# Patient Record
Sex: Male | Born: 1955 | Race: White | Hispanic: No | Marital: Married
Health system: Southern US, Community
[De-identification: ages and names within clinical notes are randomized; demographics above are authoritative.]

## PROBLEM LIST (undated history)

## (undated) HISTORY — DX: Morbid (severe) obesity due to excess calories: E66.01

---

## 2015-12-29 ENCOUNTER — Ambulatory Visit (INDEPENDENT_AMBULATORY_CARE_PROVIDER_SITE_OTHER): Payer: BLUE CROSS/BLUE SHIELD

## 2015-12-29 ENCOUNTER — Ambulatory Visit (INDEPENDENT_AMBULATORY_CARE_PROVIDER_SITE_OTHER): Payer: BLUE CROSS/BLUE SHIELD | Admitting: Sports Medicine

## 2015-12-29 ENCOUNTER — Encounter: Payer: Self-pay | Admitting: Sports Medicine

## 2015-12-29 DIAGNOSIS — M79671 Pain in right foot: Secondary | ICD-10-CM | POA: Diagnosis not present

## 2015-12-29 DIAGNOSIS — M2041 Other hammer toe(s) (acquired), right foot: Secondary | ICD-10-CM | POA: Diagnosis not present

## 2015-12-29 DIAGNOSIS — M779 Enthesopathy, unspecified: Secondary | ICD-10-CM

## 2015-12-29 NOTE — Progress Notes (Signed)
  Subjective: Miguel Beard is a 60 y.o. male patient who presents to office for evaluation of Right foot pain. Patient complains of occasional pain at ball of foot that is worse with walking barefoot x 15 years. Aspercream and change of shoes helps. Patient denies any other pedal complaints.   There are no active problems to display for this patient.   No current outpatient prescriptions on file prior to visit.   No current facility-administered medications on file prior to visit.     No Known Allergies  Objective:  General: Alert and oriented x3 in no acute distress  Dermatology: No open lesions bilateral lower extremities, no webspace macerations, no ecchymosis bilateral, all nails x 10 are well manicured.  Vascular: Dorsalis Pedis and Posterior Tibial pedal pulses 2/4, Capillary Fill Time 3 seconds,(+) pedal hair growth bilateral, no edema bilateral lower extremities, Temperature gradient within normal limits.  Neurology: Gross sensation intact via light touch bilateral. (- )Tinels sign right foot.  Musculoskeletal: Minimal tenderness to palpation sub met 2 on right. Semiflexible hammertoes, Ankle, Subtalar, Midtarsal, and MTPJ joint range of motion is within normal limits. No pain with calf compression bilateral. Strength within normal limits in all groups bilateral.   Xrays  Right Foot    Impression:Normal osseous mineralization, inferior spur, biparte fibular sesamoid, hammertoe, ankle arthritis, soft tissues normal.        Assessment and Plan: Problem List Items Addressed This Visit    None    Visit Diagnoses    Right foot pain    -  Primary   Relevant Orders   DG Foot 2 Views Right   Capsulitis       Hammer toe of right foot          -Complete examination performed -Xrays reviewed -Discussed treatement options for hammertoe and capsulitis  -Patient declined injection -Dispensed offloading pad and instructed on use if continues to work well will benefit from  orthotics; patient to call if desires orthotics -Recommend good supportive shoes -Recommend continue with aspercream as needed -Patient to return to office as needed or sooner if condition worsens.  Landis Martins, DPM

## 2017-11-21 ENCOUNTER — Other Ambulatory Visit (HOSPITAL_COMMUNITY): Payer: Self-pay | Admitting: Orthopaedic Surgery

## 2017-11-21 DIAGNOSIS — M7989 Other specified soft tissue disorders: Principal | ICD-10-CM

## 2017-11-21 DIAGNOSIS — M79604 Pain in right leg: Secondary | ICD-10-CM

## 2017-11-22 ENCOUNTER — Ambulatory Visit (HOSPITAL_COMMUNITY)
Admission: RE | Admit: 2017-11-22 | Discharge: 2017-11-22 | Disposition: A | Discharge status: Home / Self Care | Payer: BLUE CROSS/BLUE SHIELD | Source: Ambulatory Visit | Attending: Orthopaedic Surgery | Admitting: Orthopaedic Surgery

## 2017-11-22 DIAGNOSIS — M79604 Pain in right leg: Secondary | ICD-10-CM | POA: Diagnosis present

## 2017-11-22 DIAGNOSIS — M7989 Other specified soft tissue disorders: Secondary | ICD-10-CM

## 2017-11-22 NOTE — Progress Notes (Signed)
Right lower extremity venous duplex has been completed. Negative for DVT. Results were given to [facility] at Dr. Nolon Nations office.  11/22/17 10:22 AM Olen Cordial RVT

## 2018-04-22 ENCOUNTER — Other Ambulatory Visit (HOSPITAL_COMMUNITY): Payer: Self-pay | Admitting: Orthopaedic Surgery

## 2018-04-22 ENCOUNTER — Ambulatory Visit (HOSPITAL_COMMUNITY)
Admission: RE | Admit: 2018-04-22 | Discharge: 2018-04-22 | Disposition: A | Discharge status: Home / Self Care | Payer: BLUE CROSS/BLUE SHIELD | Source: Ambulatory Visit | Attending: Orthopaedic Surgery | Admitting: Orthopaedic Surgery

## 2018-04-22 DIAGNOSIS — M7989 Other specified soft tissue disorders: Secondary | ICD-10-CM

## 2018-04-22 DIAGNOSIS — M79604 Pain in right leg: Secondary | ICD-10-CM

## 2018-04-22 NOTE — Progress Notes (Signed)
Right lower extremity venous duplex has been completed. Preliminary results can be found in CV Proc through chart review.  Results were given to [facility] at Dr. Nolon Nations office.  04/22/18 1:11 PM Olen Cordial RVT

## 2018-11-21 ENCOUNTER — Other Ambulatory Visit: Payer: Self-pay | Admitting: Otolaryngology

## 2018-11-21 DIAGNOSIS — R1314 Dysphagia, pharyngoesophageal phase: Secondary | ICD-10-CM

## 2018-11-28 ENCOUNTER — Ambulatory Visit
Admission: RE | Admit: 2018-11-28 | Discharge: 2018-11-28 | Disposition: A | Discharge status: Home / Self Care | Payer: BLUE CROSS/BLUE SHIELD | Source: Ambulatory Visit | Attending: Otolaryngology | Admitting: Otolaryngology

## 2018-11-28 DIAGNOSIS — R1314 Dysphagia, pharyngoesophageal phase: Secondary | ICD-10-CM

## 2019-11-23 ENCOUNTER — Encounter: Payer: Self-pay | Admitting: Physician Assistant

## 2019-11-23 ENCOUNTER — Ambulatory Visit (HOSPITAL_COMMUNITY)
Admission: RE | Admit: 2019-11-23 | Discharge: 2019-11-23 | Disposition: A | Discharge status: Home / Self Care | Payer: BLUE CROSS/BLUE SHIELD | Source: Ambulatory Visit | Attending: Pulmonary Disease | Admitting: Pulmonary Disease

## 2019-11-23 ENCOUNTER — Other Ambulatory Visit: Payer: Self-pay | Admitting: Physician Assistant

## 2019-11-23 DIAGNOSIS — U071 COVID-19: Secondary | ICD-10-CM

## 2019-11-23 MED ORDER — DIPHENHYDRAMINE HCL 50 MG/ML IJ SOLN: 50.0000 mg | Freq: Once | INTRAMUSCULAR | Status: DC | PRN

## 2019-11-23 MED ORDER — ALBUTEROL SULFATE HFA 108 (90 BASE) MCG/ACT IN AERS: 2.0000 | INHALATION_SPRAY | Freq: Once | RESPIRATORY_TRACT | Status: DC | PRN

## 2019-11-23 MED ORDER — PROMETHAZINE-DM 6.25-15 MG/5ML PO SYRP: 5.0000 mL | ORAL_SOLUTION | Freq: Four times a day (QID) | ORAL | 0 refills | Status: AC | PRN

## 2019-11-23 MED ORDER — SODIUM CHLORIDE 0.9 % IV SOLN
1200.0000 mg | Freq: Once | INTRAVENOUS | Status: AC
  Administered 2019-11-23: 1200 mg via INTRAVENOUS

## 2019-11-23 MED ORDER — SODIUM CHLORIDE 0.9 % IV SOLN: INTRAVENOUS | Status: DC | PRN

## 2019-11-23 MED ORDER — EPINEPHRINE 0.3 MG/0.3ML IJ SOAJ: 0.3000 mg | Freq: Once | INTRAMUSCULAR | Status: DC | PRN

## 2019-11-23 MED ORDER — FAMOTIDINE IN NACL 20-0.9 MG/50ML-% IV SOLN: 20.0000 mg | Freq: Once | INTRAVENOUS | Status: DC | PRN

## 2019-11-23 MED ORDER — METHYLPREDNISOLONE SODIUM SUCC 125 MG IJ SOLR: 125.0000 mg | Freq: Once | INTRAMUSCULAR | Status: DC | PRN

## 2019-11-23 NOTE — Discharge Instructions (Signed)

## 2019-11-23 NOTE — Progress Notes (Signed)
I connected by phone with Miguel Beard on 11/23/2019 at 11:28 AM to discuss the potential use of a new treatment for mild to moderate COVID-19 viral infection in non-hospitalized patients.  This patient is a 64 y.o. male that meets the FDA criteria for Emergency Use Authorization of COVID monoclonal antibody casirivimab/imdevimab.  Has a (+) direct SARS-CoV-2 viral test result  Has mild or moderate COVID-19   Is NOT hospitalized due to COVID-19  Is within 10 days of symptom onset  Has at least one of the high risk factor(s) for progression to severe COVID-19 and/or hospitalization as defined in EUA.  Specific high risk criteria : BMI > 25   I have spoken and communicated the following to the patient or parent/caregiver regarding COVID monoclonal antibody treatment:  1. FDA has authorized the emergency use for the treatment of mild to moderate COVID-19 in adults and pediatric patients with positive results of direct SARS-CoV-2 viral testing who are 70 years of age and older weighing at least 40 kg, and who are at high risk for progressing to severe COVID-19 and/or hospitalization.  2. The significant known and potential risks and benefits of COVID monoclonal antibody, and the extent to which such potential risks and benefits are unknown.  3. Information on available alternative treatments and the risks and benefits of those alternatives, including clinical trials.  4. Patients treated with COVID monoclonal antibody should continue to self-isolate and use infection control measures (e.g., wear mask, isolate, social distance, avoid sharing personal items, clean and disinfect "high touch" surfaces, and frequent handwashing) according to CDC guidelines.   5. The patient or parent/caregiver has the option to accept or refuse COVID monoclonal antibody treatment.  After reviewing this information with the patient, the patient has agreed to receive one of the available covid 19 monoclonal  antibodies and will be provided an appropriate fact sheet prior to infusion.  Sx onset 9/23. Set up for infusion on 9/26 @ 1:30pm. Directions given to [facility]. Pt is aware that insurance will be charged an infusion fee. Pt is unvaccinated.  Cline Crock 11/23/2019 11:28 AM

## 2019-11-23 NOTE — Progress Notes (Signed)
  Diagnosis: COVID-19  Physician: Dr. Wright   Procedure: [facility] Med: casirivimab\imdevimab infusion - Provided patient with casirivimab\imdevimab fact sheet for patients, parents and caregivers prior to infusion.  Complications: No immediate complications noted.  Discharge: Discharged home   Miguel Galvan  Beard 11/23/2019   

## 2020-05-21 IMAGING — RF DG ESOPHAGUS
8 of 9 series · 14 of 24 positions shown · non-contrast
Comparison: None.

CLINICAL DATA: Pharyngo esophageal dysphagia. Choking episodes with
liquids. Sticking sensation in the throat with meats.

EXAM:
ESOPHOGRAM / BARIUM SWALLOW / BARIUM TABLET STUDY
TECHNIQUE: Combined double contrast and single contrast examination performed
using effervescent crystals, thick barium liquid, and thin barium
liquid. The patient was observed with fluoroscopy swallowing a 13 mm
barium sulphate tablet.
FLUOROSCOPY TIME:  Fluoroscopy Time:  2 minutes 24 seconds
Radiation Exposure Index (if provided by the fluoroscopic device):
339 mGy
Number of Acquired Spot Images: 6

[Series 1: sequence · 2 of 27 frames shown (1 of 5)]
[frame 5/27]
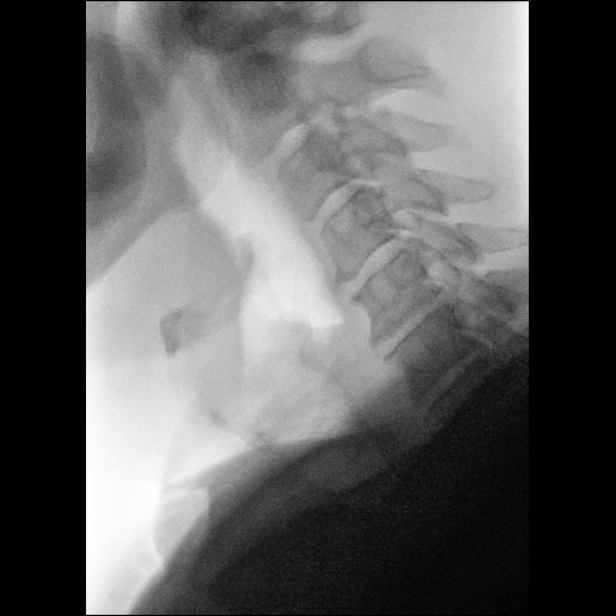
[frame 23/27]
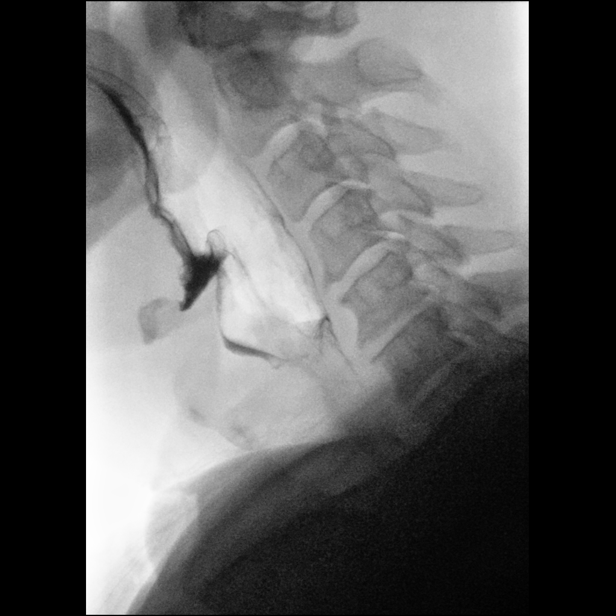

[Series 3: sequence · 2 of 19 frames shown (2 of 5)]
[frame 10/19]
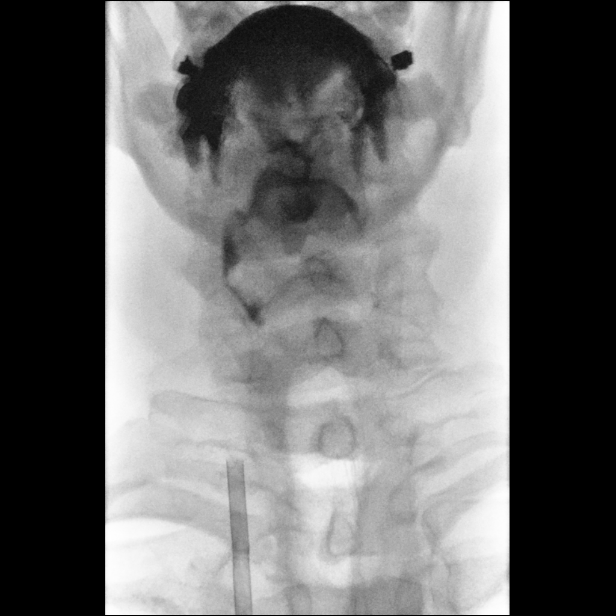
[frame 17/19]
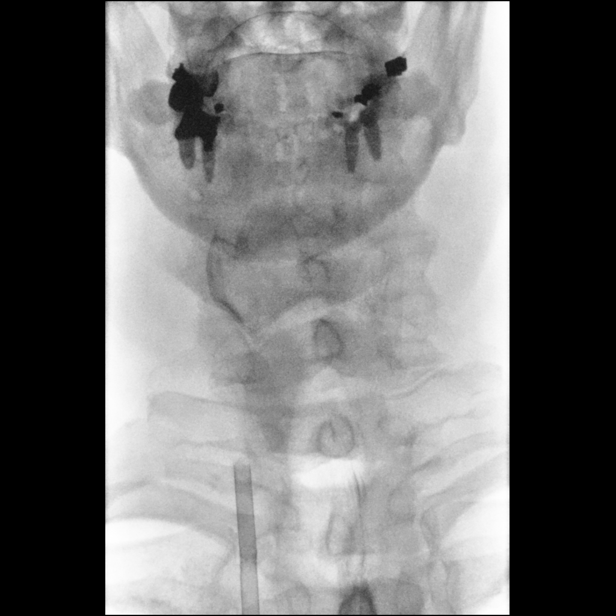

[Series 4: one shot · 0.15mm/px · 3 of 5 slices shown (1 of 3)]
[im 1/5]
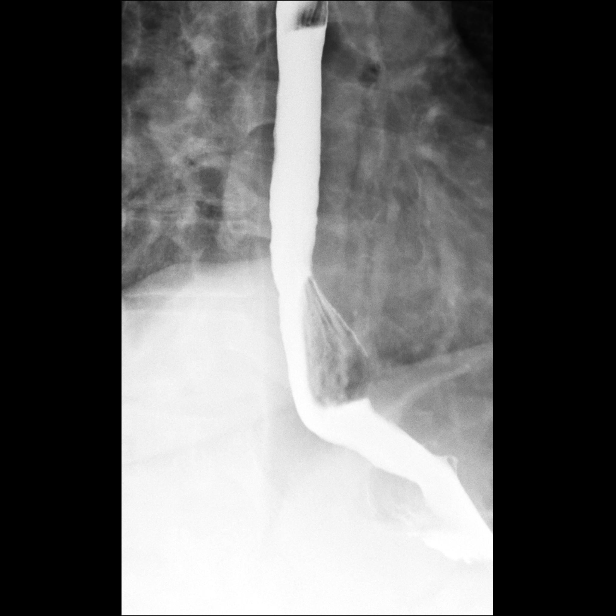
[im 3/5]
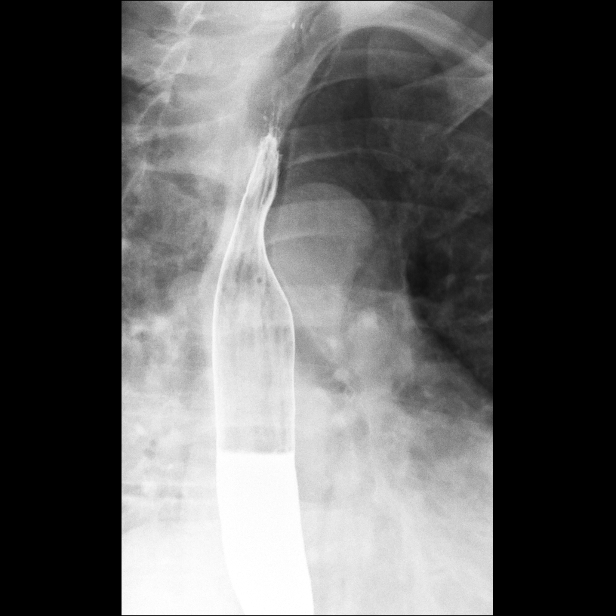
[im 5/5]
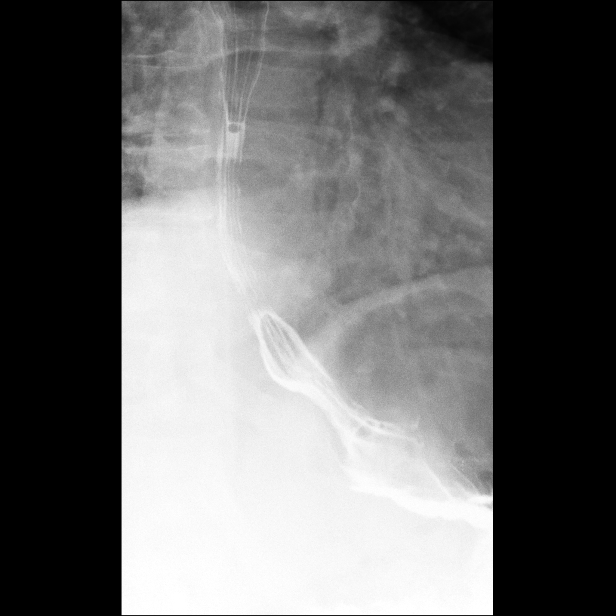

[Series 5: sequence · 2 of 44 frames shown (3 of 5)]
[frame 21/44]
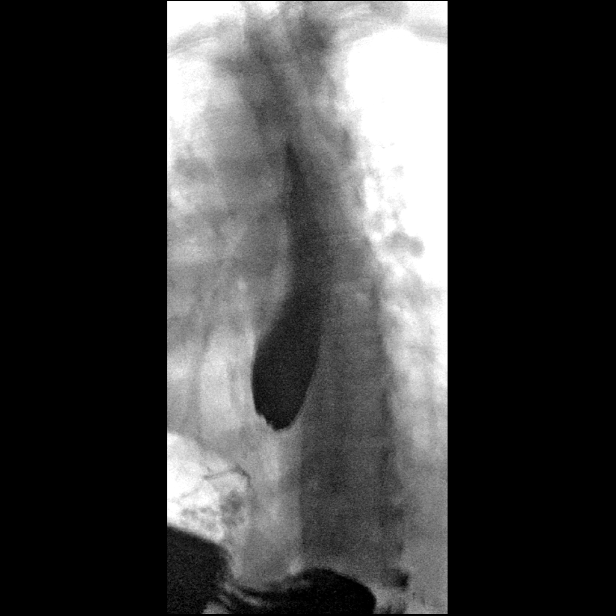
[frame 38/44]
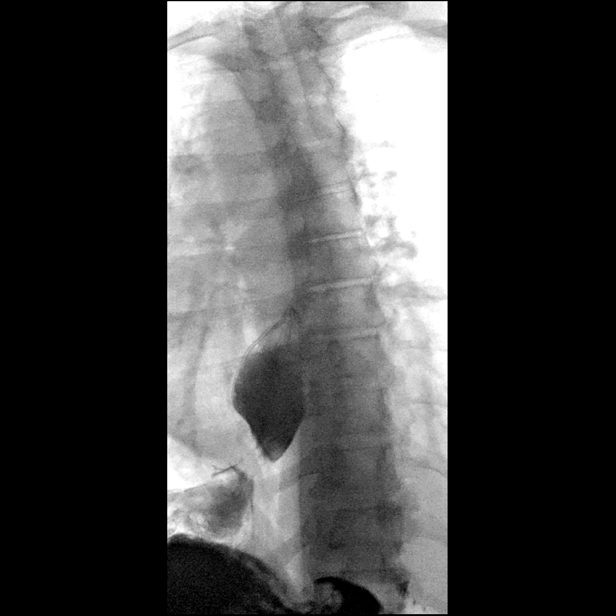

[Series 6: sequence · 1 of 79 frames shown (4 of 5)]
[frame 40/79]
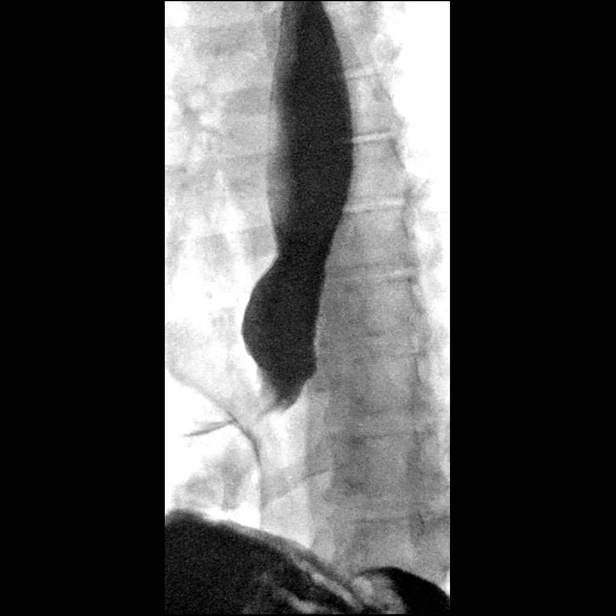

[Series 7: one shot · 1 of 1 slices shown (2 of 3)]
[im 1/1]
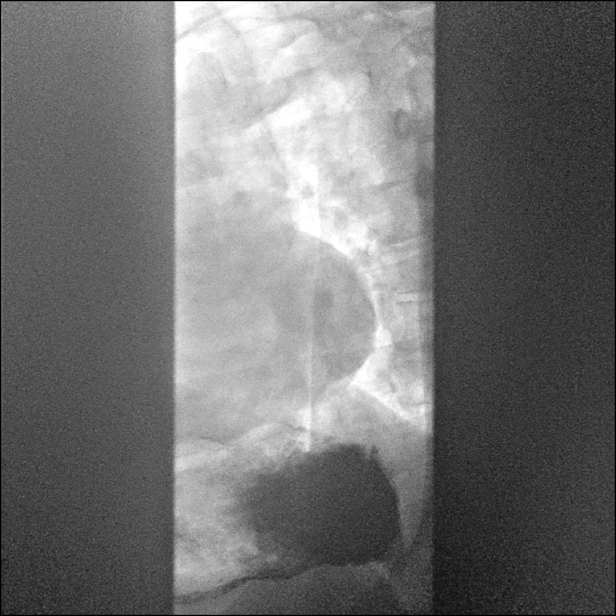

[Series 8: sequence · 2 of 66 frames shown (5 of 5)]
[frame 10/66]
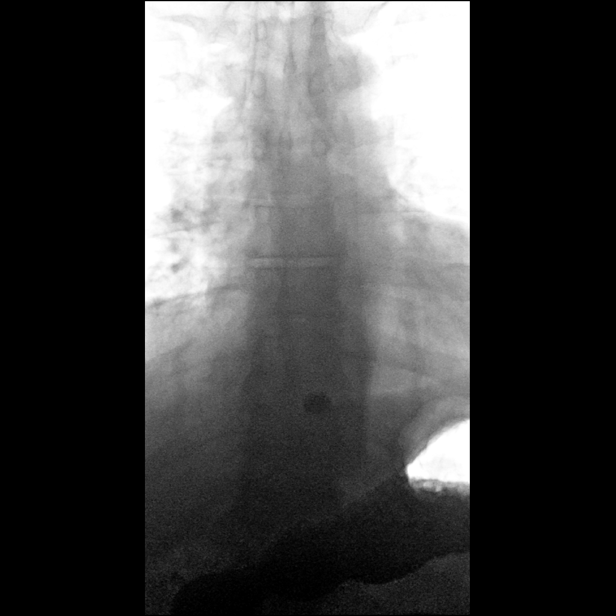
[frame 57/66]
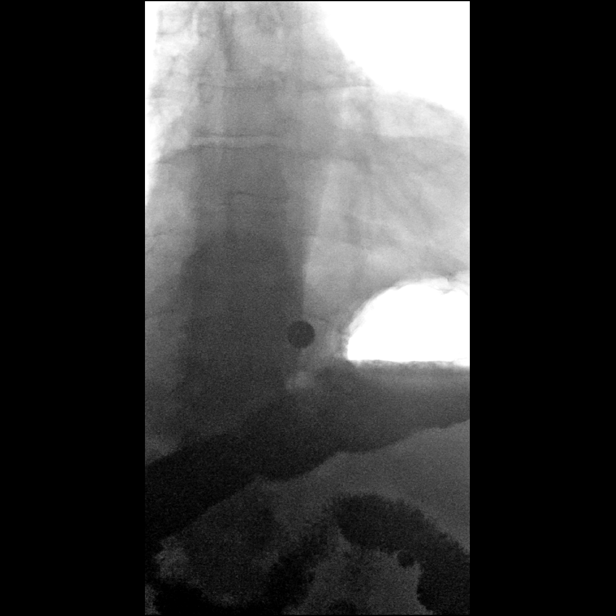

[Series 9: one shot · 1 of 2 slices shown (3 of 3)]
[im 2/2]
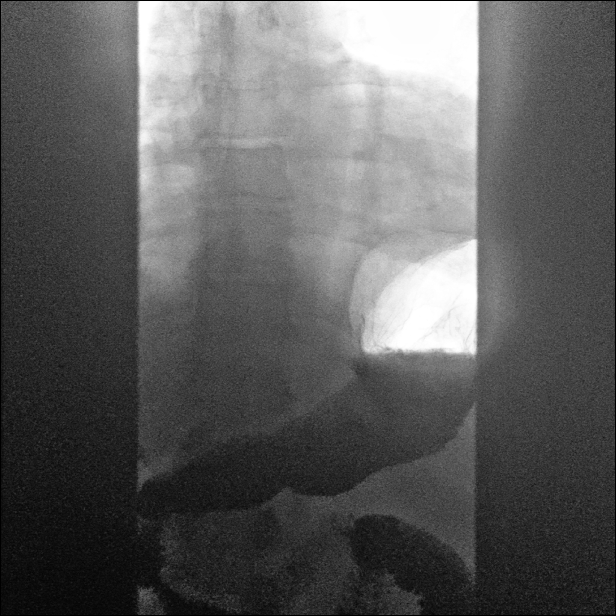

[14 of 24 positions shown; findings below may reference images not displayed]

FINDINGS: Intermittent mild barium retention in the vallecula. Otherwise
normal oral and pharyngeal phases of swallowing, with no laryngeal
penetration or tracheobronchial aspiration. No evidence of
pharyngeal mass, stricture or diverticulum. No evidence of
cricopharyngeus muscle dysfunction.

Esophageal motility is within normal limits. No hiatal hernia. No
gastroesophageal reflux elicited, despite provocative maneuvers
including water siphon test. Normal esophageal mucosa, with no
evidence of reflux esophagitis. No evidence of esophageal mass,
stricture or ulcer. Barium tablet traversed the esophagus into the
stomach without significant delay.
IMPRESSION: Unremarkable esophagram. No hiatal hernia. No gastroesophageal
reflux elicited. No evidence of esophageal mass, stricture or ulcer.

## 2023-12-24 ENCOUNTER — Other Ambulatory Visit (HOSPITAL_BASED_OUTPATIENT_CLINIC_OR_DEPARTMENT_OTHER): Payer: Self-pay | Admitting: Family Medicine

## 2023-12-24 DIAGNOSIS — J209 Acute bronchitis, unspecified: Secondary | ICD-10-CM

## 2024-03-19 ENCOUNTER — Other Ambulatory Visit (HOSPITAL_BASED_OUTPATIENT_CLINIC_OR_DEPARTMENT_OTHER): Payer: Self-pay | Admitting: Family Medicine

## 2024-03-19 DIAGNOSIS — Z136 Encounter for screening for cardiovascular disorders: Secondary | ICD-10-CM

## 2024-03-28 ENCOUNTER — Ambulatory Visit (HOSPITAL_BASED_OUTPATIENT_CLINIC_OR_DEPARTMENT_OTHER)
Admission: RE | Admit: 2024-03-28 | Discharge: 2024-03-28 | Disposition: A | Payer: Self-pay | Source: Ambulatory Visit | Attending: Family Medicine | Admitting: Family Medicine

## 2024-03-28 DIAGNOSIS — Z136 Encounter for screening for cardiovascular disorders: Secondary | ICD-10-CM
